# Patient Record
Sex: Male | Born: 1960 | Race: Black or African American | Hispanic: No | Marital: Married
Health system: Southern US, Community
[De-identification: ages and names within clinical notes are randomized; demographics above are authoritative.]

---

## 2004-10-12 ENCOUNTER — Emergency Department (HOSPITAL_COMMUNITY): Admission: EM | Admit: 2004-10-12 | Discharge: 2004-10-12 | Payer: Self-pay

## 2004-11-09 ENCOUNTER — Inpatient Hospital Stay (HOSPITAL_COMMUNITY): Admission: EM | Admit: 2004-11-09 | Discharge: 2004-11-11 | Payer: Self-pay | Admitting: Emergency Medicine

## 2005-05-24 IMAGING — CT CT MAXILLOFACIAL W/ CM
3 of 5 series · 14 of 30 positions shown, 15 images · IV contrast ([ID] OMNI 300)
Comparison: none

CLINICAL DATA: Left forearm wound, left eye swelling.  
 CT HEAD WITH AND WITHOUT CONTRAST; CT ORBITS WITH CONTRAST:
TECHNIQUE: CT head without contrast was performed. Subsequently 100 cc of Omnipaque 300 was injected. CT head with contrast and CT orbit with contrast was performed. 
 CT HEAD WITH AND WITHOUT CONTRAST:
 The brain parenchyma, ventricular system and extra-axial space are within normal limits. There is no mass effect, midline shift, acute hemorrhage or abnormal enhancement.  Soft tissue swelling involving the orbit is present.  Marked mucosal thickening is seen in the paranasal sinuses predominantly the maxillary and ethmoid air cells.  The anterior bony calvarium and facial bones are somewhat thickened with ground glass density compatible with fibrous dysplasia.

[Series 3: brain · axial · 0.47mm/px · z∈[+160,+325]mm · 3 of 32 slices shown, 4 images]
[im 1/32  brain]
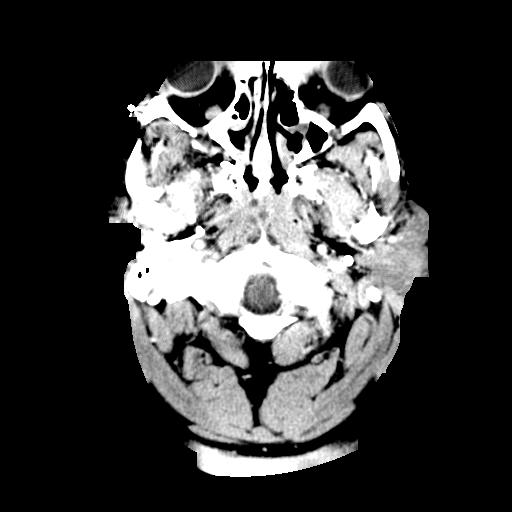
[im 1/32  bone]
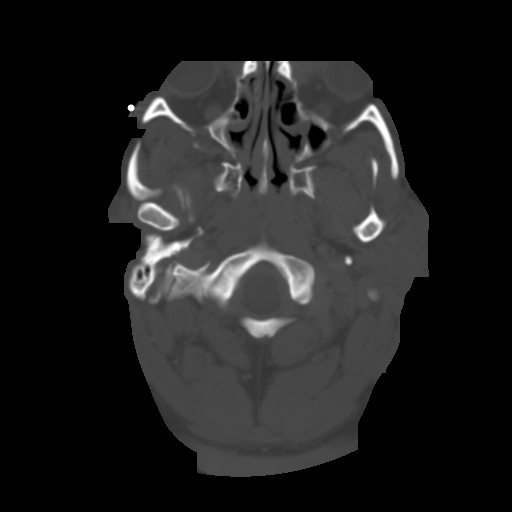
[im 16/32  bone]
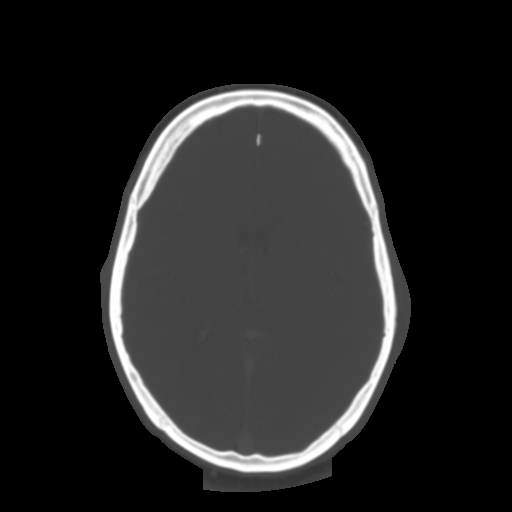
[im 32/32  bone]
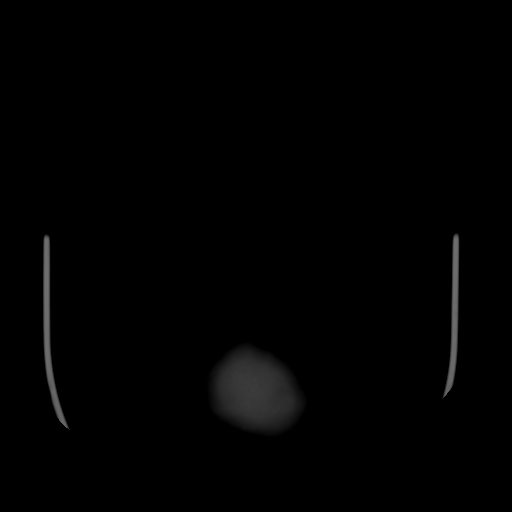

[Series 6: recon 2: supine facial bones · axial · 0.33mm/px · z∈[+78,+182]mm · 8 of 189 slices shown]
[im 12/189  bone]
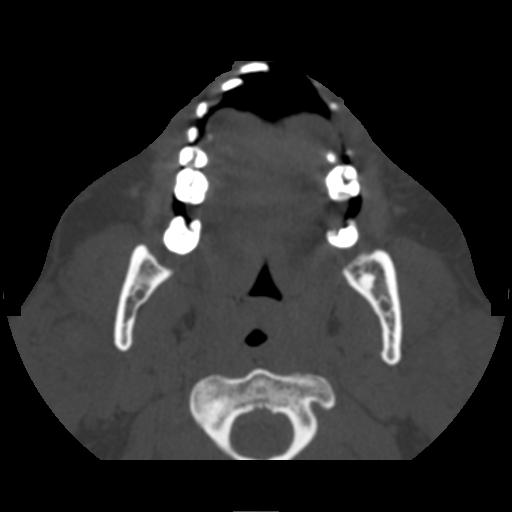
[im 36/189  bone]
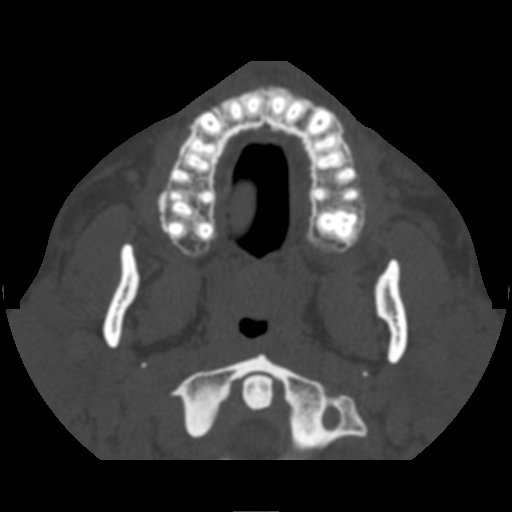
[im 59/189  bone]
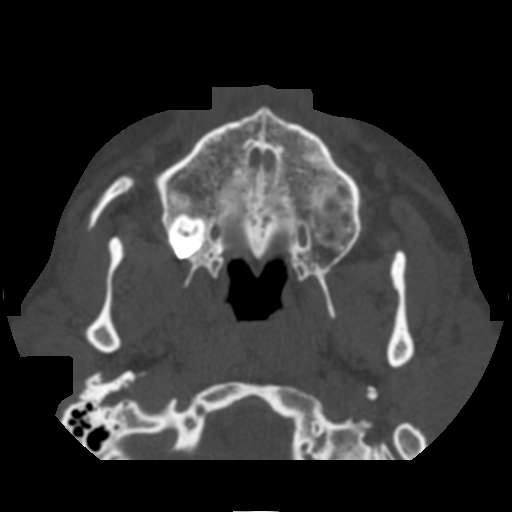
[im 83/189  bone]
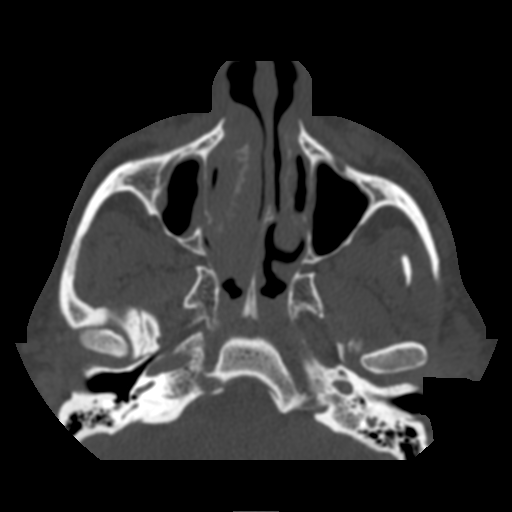
[im 106/189  bone]
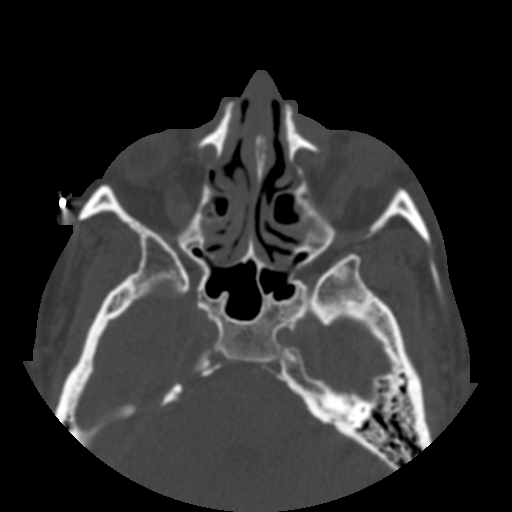
[im 130/189  bone]
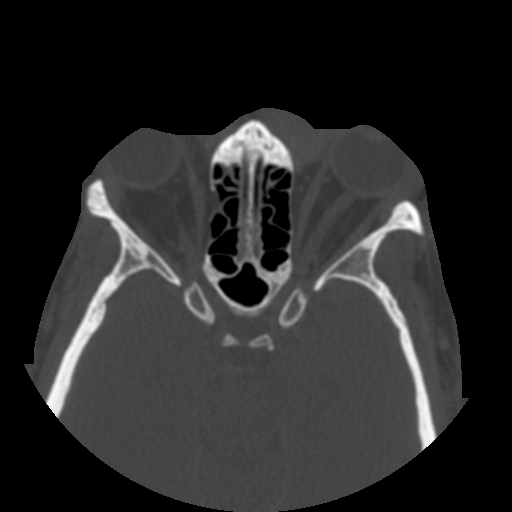
[im 153/189  bone]
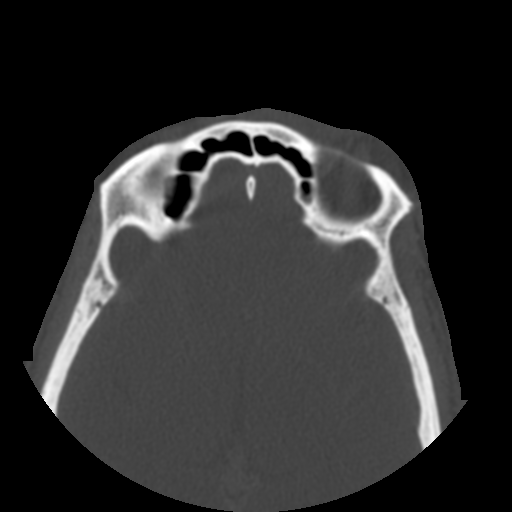
[im 177/189  bone]
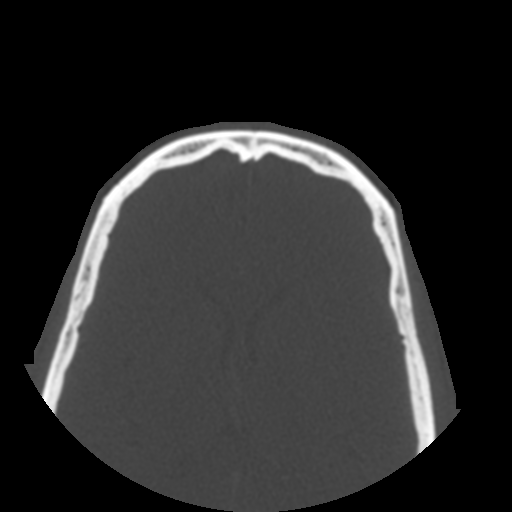

[Series 600: reformatted · coronal · 0.33mm/px · 3 of 57 slices shown]
[im 15/57  bone]
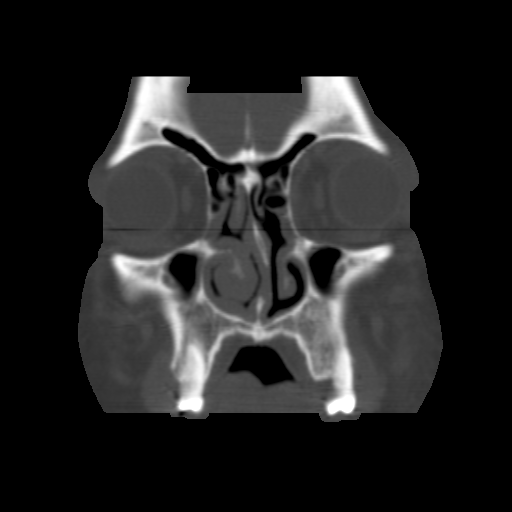
[im 29/57  bone]
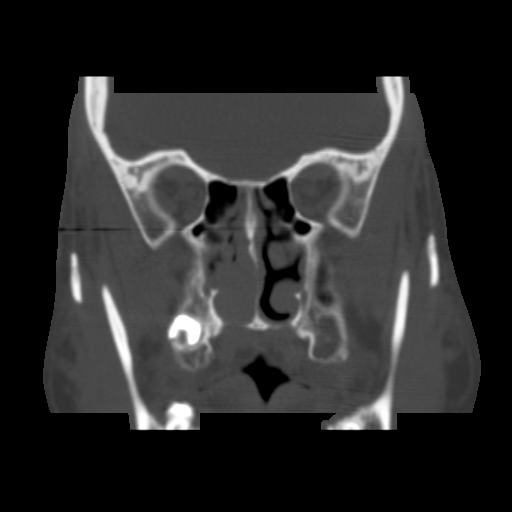
[im 43/57  bone]
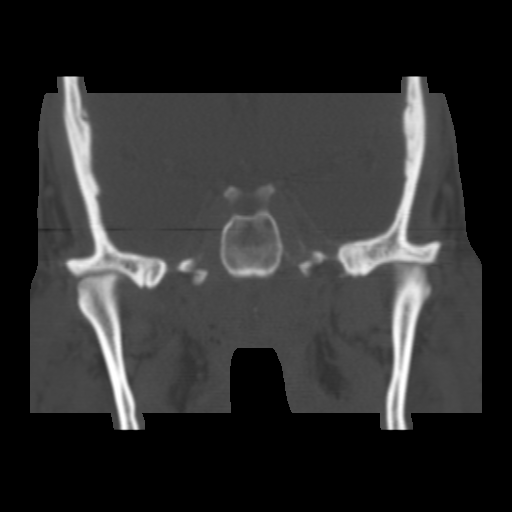

[14 of 30 positions shown; findings below may reference images not displayed]

IMPRESSION: 1. Chronic sinusitis. 
 2.  Soft tissue swelling anterior to the left orbit.  
 3.  Fibrous dysplasia of the osseous structures.
 4.  Otherwise, no evidence for acute intracranial pathology. 
 CT ORBIT WITH CONTRAST:
 Marked soft tissue swelling anterior to the nose and left orbit is present.  There is no evidence of an inflammatory process within the left orbit.  There is no thickening of the extraocular muscles.  There is no intraorbital abscess.  There is no evidence of bony destruction.  
 Bone thickening of the facial bones with ground glass density is seen compatible with fibrous dysplasia.  Mucosal thickening in the maxillary and ethmoid air cells is noted compatible with chronic sinusitis.  No air fluid levels are seen.
IMPRESSION: 1.  Cellulitis of the soft tissues anterior to the left orbit. There is no evidence of an inflammatory process within the left orbit. 
 2.  Chronic sinusitis. 
 3.  Fibrous dysplasia.

## 2005-05-25 IMAGING — CR DG CHEST 1V PORT
1 series · 1 of 1 positions shown · non-contrast
Comparison: None.

CLINICAL DATA: Right PICC line placement by IV therapy.

PORTABLE CHEST - 1 VIEW

[view not recorded]
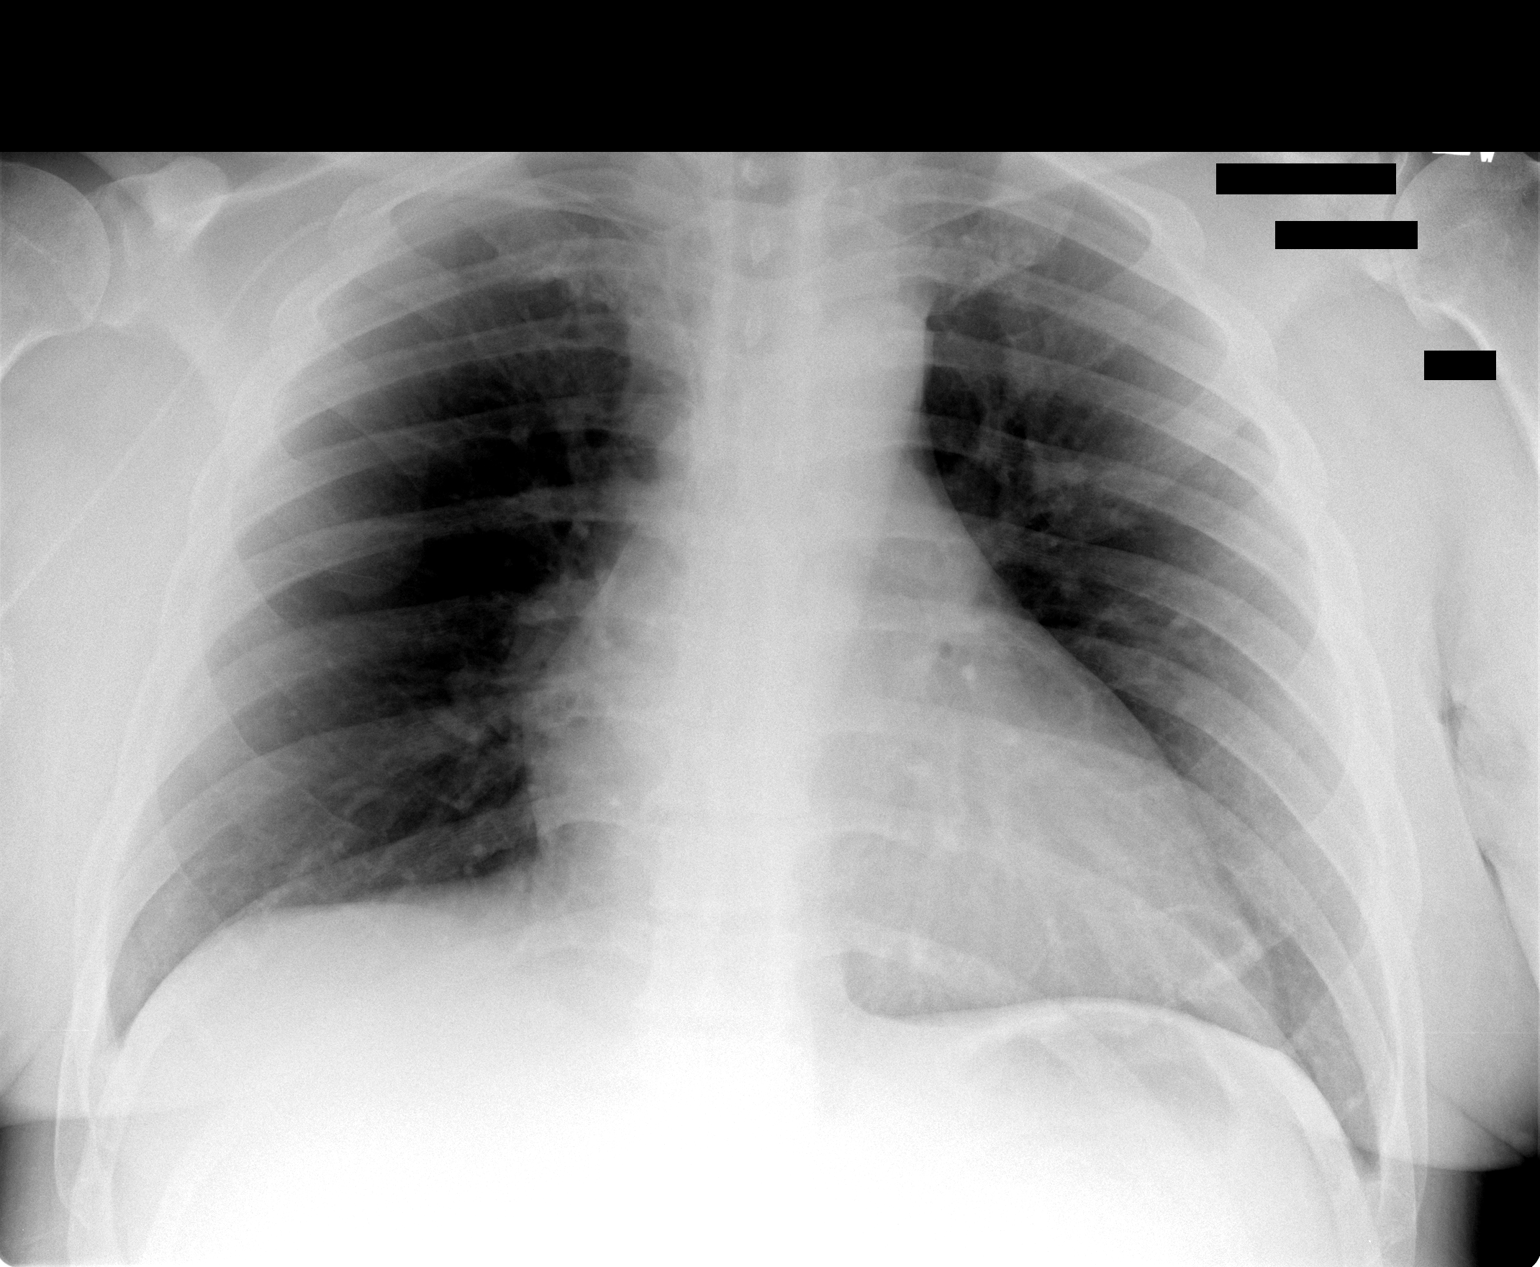

[1 of 1 positions shown; findings below may reference images not displayed]

FINDINGS: Borderline enlarged cardiac silhouette. Clear lungs with normal
vascularity. Right PICC tip in the superior vena cava. Left shoulder and lower
thoracic spine degenerative changes.  

IMPRESSION

1. Borderline cardiomegaly.

2. Left shoulder degenerative changes.

## 2014-09-01 NOTE — Care Management Note (Unsigned)
    Page 1 of 1   08/27/2014     1:04:51 PM CARE MANAGEMENT NOTE 08/27/2014  Patient:  Melinda CrutchBROWN,Yash   Account Number:  [id number]  Date Initiated:  08/27/2014  Documentation initiated by:  [facility] LLCJEFFRIES,Louretta Tantillo  Subjective/Objective Assessment:   adm: ANTERIOR LATERAL LUMBAR FUSION 1 LEVEL (N/A)  POSTERIOR LUMBAR FUSION 1 LEVEL (N/A)     Action/Plan:   discharge planning   Anticipated DC Date:  08/27/2014   Anticipated DC Plan:  HOME/SELF CARE      DC Planning Services  CM consult      PAC Choice  DURABLE MEDICAL EQUIPMENT   Choice offered to / List presented to:     DME arranged  3-N-1  WALKER - ROLLING           Status of service:  In process, will continue to follow Medicare Important Message given?   (If response is "NO", the following Medicare IM given date fields will be blank) Date Medicare IM given:   Medicare IM given by:   Date Additional Medicare IM given:   Additional Medicare IM given by:    Discharge Disposition:  HOME/SELF CARE  Per UR Regulation:    If discussed at Long Length of Stay Meetings, dates discussed:    Comments:  08/27/14 13:00 Cm spoke to pt to let hime know the wait for a rolling walker and 3n1 would be 4-5 hours bc his insurance is onbly accepted by 2 DME agencies both of which have an ETA 4-5 hours.  Pt states that is fine.  CM called RN to get orders and Face to Face from MD as arrangements cannot be finalized without F2F and ordres. CM called [facility] and received callback from delivery person who states he has no Rolling walkers until late monday evening.  CM cancels [facility] order and is waiting for orders to be placed before arrangements can be made with [facility].  Freddy JakschSarah Mei Suits, BSN, CM [phone].

## 2023-09-19 DEATH — deceased
